# Patient Record
Sex: Male | Born: 1975 | Race: White | Hispanic: No | Marital: Single | Smoking: Never smoker
Health system: Southern US, Community
[De-identification: ages and names within clinical notes are randomized; demographics above are authoritative.]

## PROBLEM LIST (undated history)

## (undated) DIAGNOSIS — F419 Anxiety disorder, unspecified: Secondary | ICD-10-CM

## (undated) DIAGNOSIS — I10 Essential (primary) hypertension: Secondary | ICD-10-CM

## (undated) DIAGNOSIS — E7800 Pure hypercholesterolemia, unspecified: Secondary | ICD-10-CM

## (undated) HISTORY — DX: Pure hypercholesterolemia, unspecified: E78.00

## (undated) HISTORY — DX: Essential (primary) hypertension: I10

## (undated) HISTORY — PX: OTHER SURGICAL HISTORY: SHX169

---

## 2002-12-04 ENCOUNTER — Emergency Department (HOSPITAL_COMMUNITY): Admission: AC | Admit: 2002-12-04 | Discharge: 2002-12-04 | Payer: Self-pay

## 2002-12-04 ENCOUNTER — Encounter: Payer: Self-pay | Admitting: Emergency Medicine

## 2003-02-02 ENCOUNTER — Encounter: Admission: RE | Admit: 2003-02-02 | Discharge: 2003-02-22 | Payer: Self-pay | Admitting: Orthopedic Surgery

## 2006-07-18 ENCOUNTER — Emergency Department (HOSPITAL_COMMUNITY): Admission: EM | Admit: 2006-07-18 | Discharge: 2006-07-18 | Payer: Self-pay | Admitting: Emergency Medicine

## 2010-12-29 ENCOUNTER — Ambulatory Visit (INDEPENDENT_AMBULATORY_CARE_PROVIDER_SITE_OTHER): Payer: 59 | Admitting: Surgery

## 2010-12-29 ENCOUNTER — Encounter (INDEPENDENT_AMBULATORY_CARE_PROVIDER_SITE_OTHER): Payer: Self-pay | Admitting: Surgery

## 2010-12-29 VITALS — BP 122/72 | HR 82 | Temp 97.9°F | Ht 69.0 in | Wt 237.5 lb

## 2010-12-29 DIAGNOSIS — R229 Localized swelling, mass and lump, unspecified: Secondary | ICD-10-CM

## 2010-12-29 DIAGNOSIS — R609 Edema, unspecified: Secondary | ICD-10-CM

## 2010-12-29 NOTE — Progress Notes (Signed)
Benjamin Beck presents with a history of traumatic testicular injury about 10 years ago. His left him with extremely sensitive testicle to compression. He has lived with this but recently had to have a physical for the [employer]. This led to a Dr. Lonia Blood his scrotum causing a lot of pain. His medium reluctant to seek care but he did see Dr. Jeannetta Nap who referred him to me to rule out hernia.  On physical exam his scrotum is about the size of a tennis ball on the left side and it is tender. I think it may be fluid filled sleep this could be a traumatic hydrocele. I cannot feel a definite hernia when I got  him to cough and palpate his internal ring.Will obtain an ultrasound of his testicle to see if this is a traumatic hydrocele or could represent an underlying neoplasm.    I will see him back after the ultrasound to generate an appropriate disposition.

## 2011-01-01 ENCOUNTER — Other Ambulatory Visit (INDEPENDENT_AMBULATORY_CARE_PROVIDER_SITE_OTHER): Payer: Self-pay | Admitting: Surgery

## 2011-01-01 DIAGNOSIS — R609 Edema, unspecified: Secondary | ICD-10-CM

## 2011-01-04 ENCOUNTER — Other Ambulatory Visit: Payer: Self-pay

## 2011-01-05 ENCOUNTER — Other Ambulatory Visit (INDEPENDENT_AMBULATORY_CARE_PROVIDER_SITE_OTHER): Payer: Self-pay | Admitting: General Surgery

## 2011-01-05 ENCOUNTER — Ambulatory Visit
Admission: RE | Admit: 2011-01-05 | Discharge: 2011-01-05 | Disposition: A | Discharge status: Home / Self Care | Payer: 59 | Source: Ambulatory Visit | Attending: Surgery | Admitting: Surgery

## 2011-01-05 ENCOUNTER — Telehealth (INDEPENDENT_AMBULATORY_CARE_PROVIDER_SITE_OTHER): Payer: Self-pay | Admitting: General Surgery

## 2011-01-05 DIAGNOSIS — N433 Hydrocele, unspecified: Secondary | ICD-10-CM

## 2011-01-05 DIAGNOSIS — R609 Edema, unspecified: Secondary | ICD-10-CM

## 2011-01-05 NOTE — Telephone Encounter (Signed)
Notified pts wife of results to [place] and that we will refer him to a urologist.

## 2011-01-05 NOTE — Telephone Encounter (Signed)
Message copied by Latricia Heft on Fri Jan 05, 2011  5:35 PM ------      Message from: Cathi Roan      Created: Fri Jan 05, 2011  2:20 PM       Mother calling for Ultra Sound results.      [phone]

## 2013-02-24 IMAGING — US US SCROTUM
1 series · 13 of 25 positions shown · non-contrast
Comparison: None.

CLINICAL DATA: Left scrotal pain and swelling over the past 3
weeks.

SCROTAL ULTRASOUND
DOPPLER ULTRASOUND OF THE TESTICLES
TECHNIQUE: Complete ultrasound examination of the testicles,
epididymis, and other scrotal structures was performed.  Color and
spectral Doppler ultrasound were also utilized to evaluate blood
flow to the testicles.

[Series 1: us scrotum · 0.08mm/px · 13 of 49 slices shown]
[im 1/49]
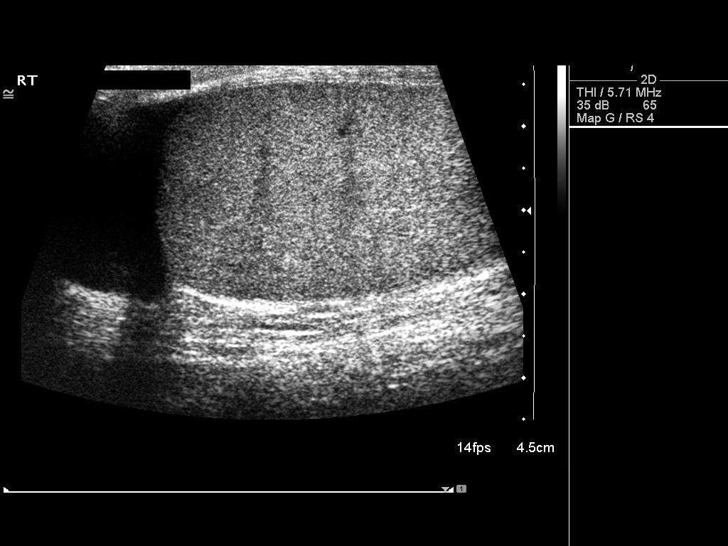
[im 5/49]
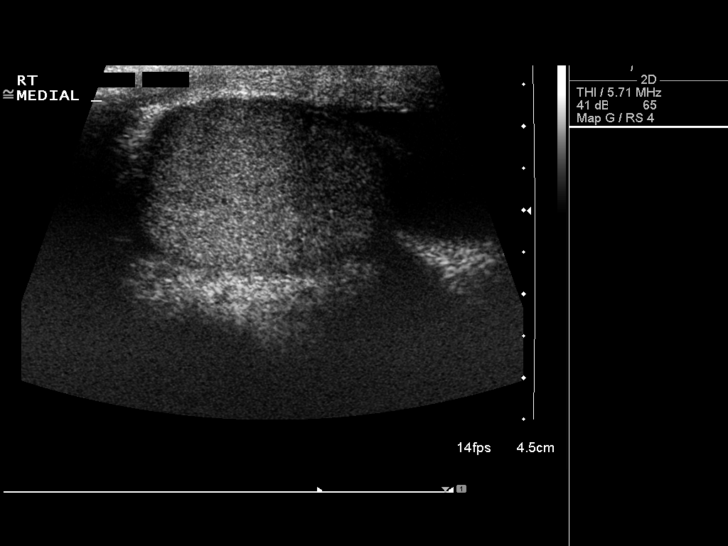
[im 9/49]
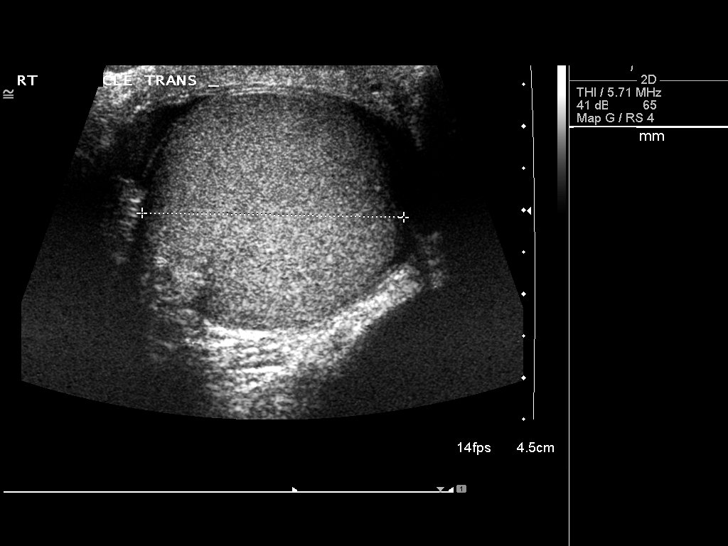
[im 13/49]
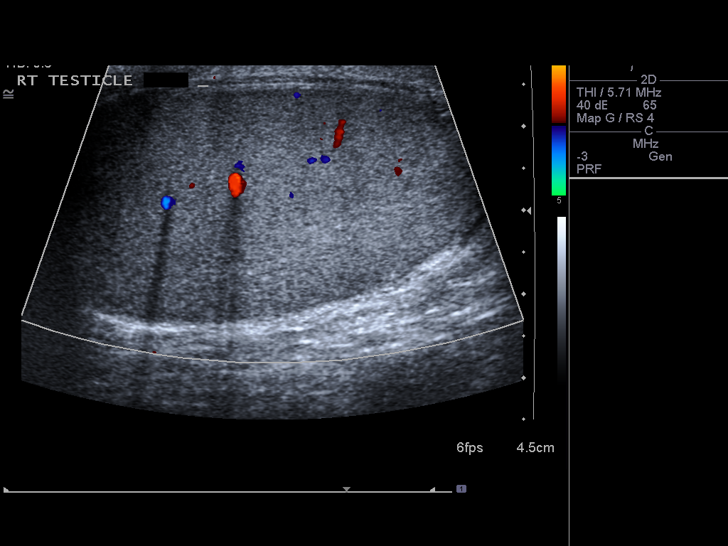
[im 17/49]
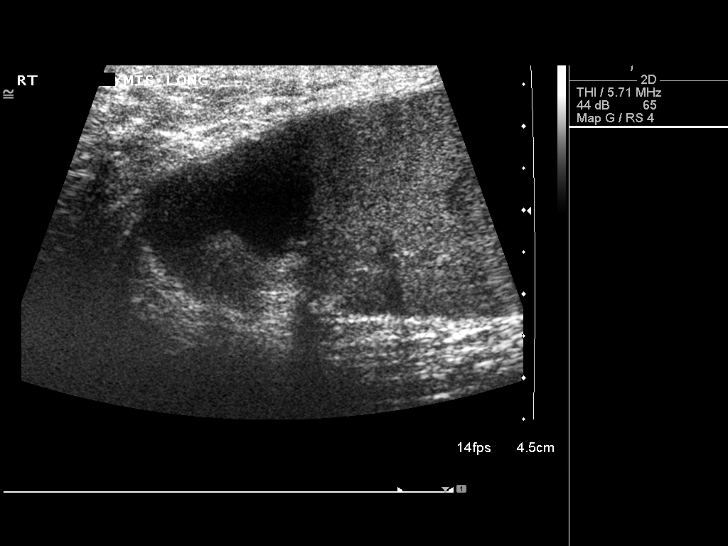
[im 21/49]
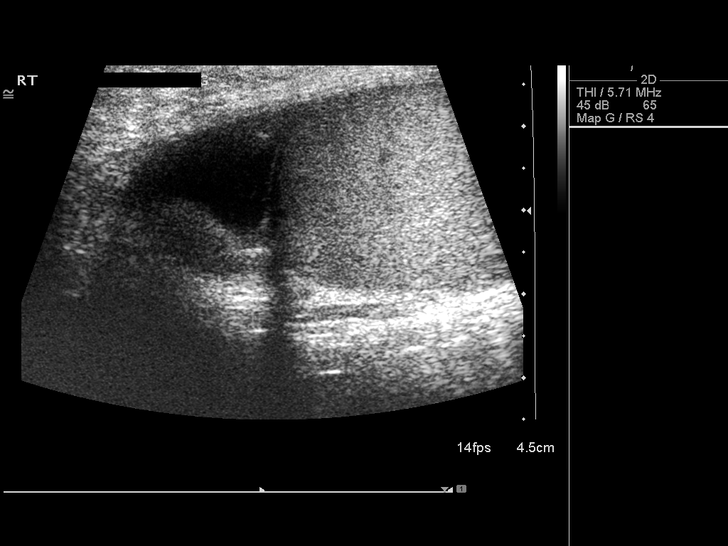
[im 25/49]
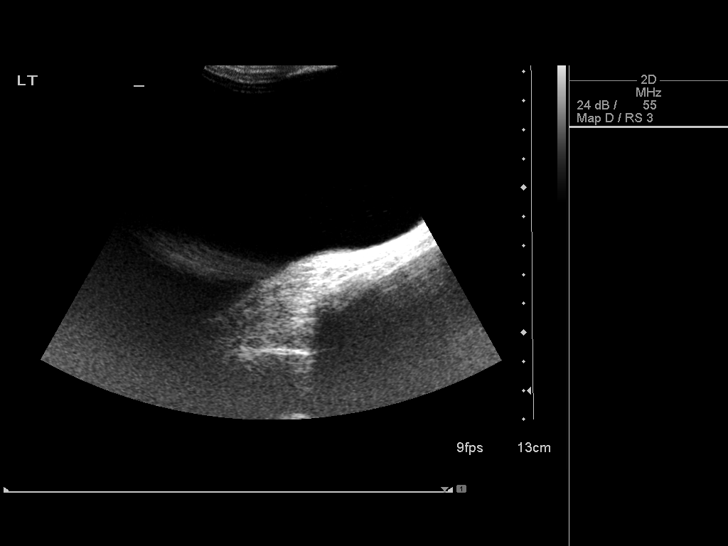
[im 29/49]
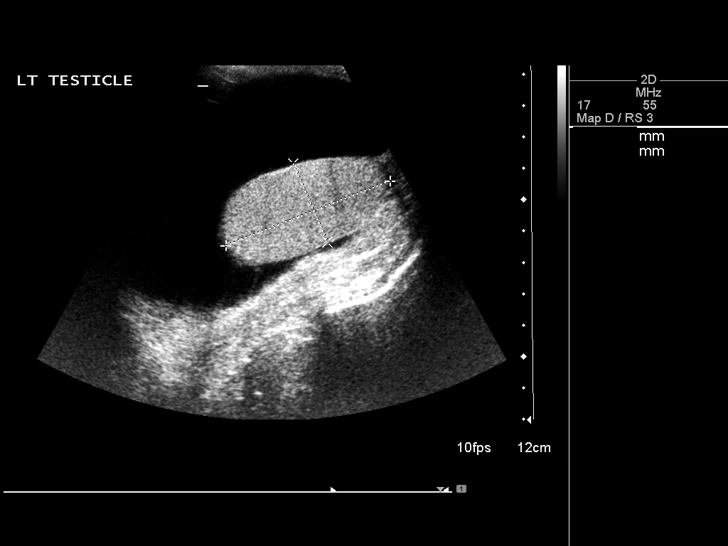
[im 33/49]
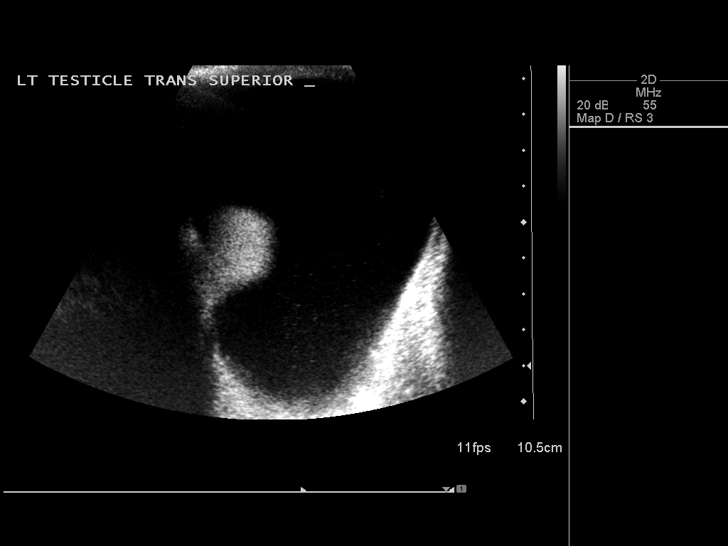
[im 37/49]
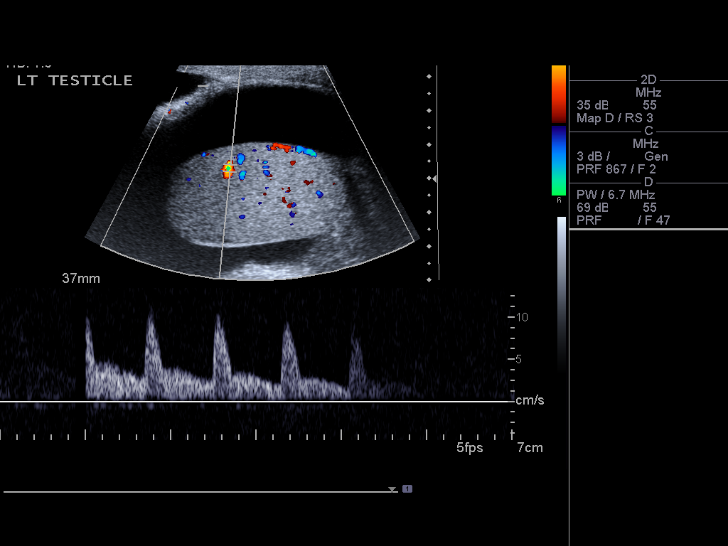
[im 41/49]
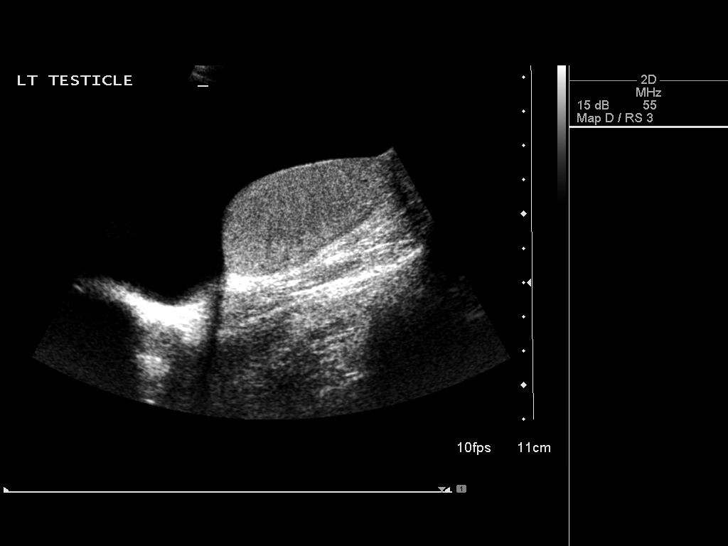
[im 45/49]
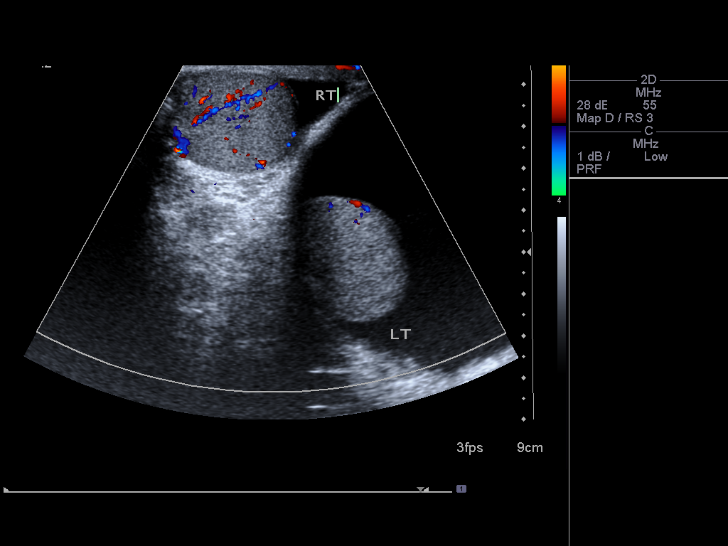
[im 49/49]
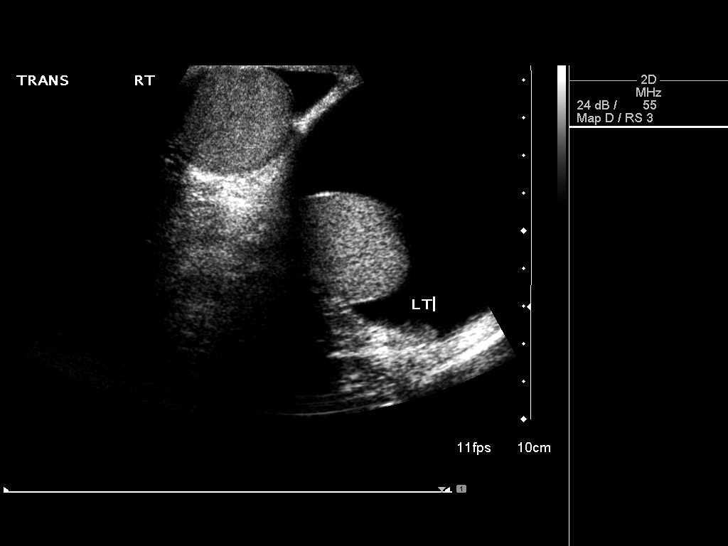

[13 of 25 positions shown; findings below may reference images not displayed]

FINDINGS: Right testis:  Normal size and parenchymal echotexture without
focal parenchymal abnormality, measuring approximately 5.2 x 3.0 x
3.1 cm.  Normal color Doppler signal within the testis.

Left testis:  Normal size and parenchymal echotexture without focal
parenchymal abnormality, measuring approximately 5.7 x 2.7 x
cm.  Normal color Doppler signal within the testis.

Right epididymis:  Limited visualization.  No evidence of
epididymitis.

Left epididymis:  Limited visualization.  No evidence of
epididymitis.

Hydocele:  Very large left hydrocele with simple appearing fluid.
Very small, insignificant right hydrocele.

Varicocele:  Absent bilaterally.

Pulsed Doppler interrogation of both testes demonstrates normal
arterial and venous waveforms in both testes.
IMPRESSION: 1.  Very large, simple appearing left hydrocele.
2.  Otherwise normal scrotal ultrasound.  Specifically, no evidence
of left testicular torsion or epididymo-orchitis.

## 2018-09-08 NOTE — Progress Notes (Signed)
Called patient to set-up covid testing prior to surgery. Patient refuses to have covid testing done and stated "I will cancel my surgery until covid is over".  Patient instructed to call surgeon.

## 2018-09-09 ENCOUNTER — Encounter (HOSPITAL_COMMUNITY): Admission: RE | Payer: Self-pay | Source: Home / Self Care

## 2018-09-09 ENCOUNTER — Ambulatory Visit (HOSPITAL_COMMUNITY): Admission: RE | Admit: 2018-09-09 | Payer: BLUE CROSS/BLUE SHIELD | Source: Home / Self Care | Admitting: Surgery

## 2018-09-09 SURGERY — REPAIR, HERNIA, INGUINAL, ADULT
Anesthesia: General | Laterality: Left

## 2018-12-19 ENCOUNTER — Ambulatory Visit: Payer: Self-pay | Admitting: Surgery

## 2018-12-19 NOTE — Progress Notes (Signed)
Requested orders from triage ccs Dr. Hassell Done

## 2018-12-19 NOTE — Patient Instructions (Signed)
DUE TO COVID-19 ONLY ONE VISITOR IS ALLOWED TO COME WITH YOU AND STAY IN THE WAITING ROOM ONLY DURING PRE OP AND PROCEDURE DAY OF SURGERY. THE 1 VISITOR MAY VISIT WITH YOU AFTER SURGERY IN YOUR PRIVATE ROOM DURING VISITING HOURS ONLY!  YOU NEED TO HAVE A COVID 19 TEST ON_______ @_______ , THIS TEST MUST BE DONE BEFORE SURGERY, COME  [place], [address] , [id number].  ([place]) ONCE YOUR COVID TEST IS COMPLETED, PLEASE BEGIN THE QUARANTINE INSTRUCTIONS AS OUTLINED IN YOUR HANDOUT.                Benjamin Beck  12/19/2018   Your procedure is scheduled on: 12-23-18   Report to [facility] Main  Entrance             Report to admitting at       [place] AM     Call this number if you have problems the morning of surgery [phone]    Remember: Do not eat food or drink liquids :After Midnight. BRUSH YOUR TEETH MORNING OF SURGERY AND RINSE YOUR MOUTH OUT, NO CHEWING GUM CANDY OR MINTS.     Take these medicines the morning of surgery with A SIP OF WATER: none  DO NOT TAKE ANY DIABETIC MEDICATIONS DAY OF YOUR SURGERY                               You may not have any metal on your body including hair pins and              piercings  Do not wear jewelry,lotions, powders or perfumes, deodorant                    Men may shave face and neck.   Do not bring valuables to the hospital. [place].  Contacts, dentures or bridgework may not be worn into surgery.      Patients discharged the day of surgery will not be allowed to drive home. IF YOU ARE HAVING SURGERY AND GOING HOME THE SAME DAY, YOU MUST HAVE AN ADULT TO DRIVE YOU HOME AND BE WITH YOU FOR 24 HOURS. YOU MAY GO HOME BY TAXI OR UBER OR ORTHERWISE, BUT AN ADULT MUST ACCOMPANY YOU HOME AND STAY WITH YOU FOR 24 HOURS.  Name and phone number of your driver:  Special Instructions: N/A              Please read over the following fact sheets you were  given: _____________________________________________________________________             [facility] - Preparing for Surgery Before surgery, you can play an important role.  Because skin is not sterile, your skin needs to be as free of germs as possible.  You can reduce the number of germs on your skin by washing with CHG (chlorahexidine gluconate) soap before surgery.  CHG is an antiseptic cleaner which kills germs and bonds with the skin to continue killing germs even after washing. Please DO NOT use if you have an allergy to CHG or antibacterial soaps.  If your skin becomes reddened/irritated stop using the CHG and inform your nurse when you arrive at Short Stay. Do not shave (including legs and underarms) for at least 48 hours prior to the first CHG  shower.  You may shave your face/neck. Please follow these instructions carefully:  1.  Shower with CHG Soap the night before surgery and the  morning of Surgery.  2.  If you choose to wash your hair, wash your hair first as usual with your  normal  shampoo.  3.  After you shampoo, rinse your hair and body thoroughly to remove the  shampoo.                           4.  Use CHG as you would any other liquid soap.  You can apply chg directly  to the skin and wash                       Gently with a scrungie or clean washcloth.  5.  Apply the CHG Soap to your body ONLY FROM THE NECK DOWN.   Do not use on face/ open                           Wound or open sores. Avoid contact with eyes, ears mouth and genitals (private parts).                       Wash face,  Genitals (private parts) with your normal soap.             6.  Wash thoroughly, paying special attention to the area where your surgery  will be performed.  7.  Thoroughly rinse your body with warm water from the neck down.  8.  DO NOT shower/wash with your normal soap after using and rinsing off  the CHG Soap.                9.  Pat yourself dry with a clean towel.            10.  Wear  clean pajamas.            11.  Place clean sheets on your bed the night of your first shower and do not  sleep with pets. Day of Surgery : Do not apply any lotions/deodorants the morning of surgery.  Please wear clean clothes to the hospital/surgery center.  FAILURE TO FOLLOW THESE INSTRUCTIONS MAY RESULT IN THE CANCELLATION OF YOUR SURGERY PATIENT SIGNATURE_________________________________  NURSE SIGNATURE__________________________________  ________________________________________________________________________

## 2018-12-19 NOTE — Progress Notes (Addendum)
PCP - Nada Boozer Cardiologist - n/a  Chest x-ray -  EKG -  12-23-18 epic Stress Test -  ECHO -  Cardiac Cath -   Sleep Study -  CPAP -   Fasting Blood Sugar -  Checks Blood Sugar ___0__ times a day  Blood Thinner Instructions: Aspirin Instructions:  Anesthesia review:   BS 337 at preop, Phenterminw last dose 12-21-18  ,  hgba1c 8.6  Patient denies shortness of breath, fever, cough and chest pain at PAT appointment   asymptomatic   Patient verbalized understanding of instructions that were given to them at the PAT appointment. Patient was also instructed that they will need to review over the PAT instructions again at home before surgery.

## 2018-12-23 ENCOUNTER — Encounter (HOSPITAL_COMMUNITY): Payer: Self-pay

## 2018-12-23 ENCOUNTER — Other Ambulatory Visit (HOSPITAL_COMMUNITY)
Admission: RE | Admit: 2018-12-23 | Discharge: 2018-12-23 | Disposition: A | Discharge status: Home / Self Care | Payer: BC Managed Care – PPO | Source: Ambulatory Visit | Attending: Surgery | Admitting: Surgery

## 2018-12-23 ENCOUNTER — Encounter (HOSPITAL_COMMUNITY)
Admission: RE | Admit: 2018-12-23 | Discharge: 2018-12-23 | Disposition: A | Discharge status: Home / Self Care | Payer: BC Managed Care – PPO | Source: Ambulatory Visit | Attending: Surgery | Admitting: Surgery

## 2018-12-23 ENCOUNTER — Other Ambulatory Visit: Payer: Self-pay

## 2018-12-23 DIAGNOSIS — Z7984 Long term (current) use of oral hypoglycemic drugs: Secondary | ICD-10-CM | POA: Insufficient documentation

## 2018-12-23 DIAGNOSIS — Z01818 Encounter for other preprocedural examination: Secondary | ICD-10-CM | POA: Insufficient documentation

## 2018-12-23 DIAGNOSIS — Z20828 Contact with and (suspected) exposure to other viral communicable diseases: Secondary | ICD-10-CM | POA: Diagnosis not present

## 2018-12-23 DIAGNOSIS — N433 Hydrocele, unspecified: Secondary | ICD-10-CM | POA: Insufficient documentation

## 2018-12-23 DIAGNOSIS — E7800 Pure hypercholesterolemia, unspecified: Secondary | ICD-10-CM | POA: Diagnosis not present

## 2018-12-23 DIAGNOSIS — Z79899 Other long term (current) drug therapy: Secondary | ICD-10-CM | POA: Insufficient documentation

## 2018-12-23 DIAGNOSIS — F419 Anxiety disorder, unspecified: Secondary | ICD-10-CM | POA: Diagnosis not present

## 2018-12-23 DIAGNOSIS — K4090 Unilateral inguinal hernia, without obstruction or gangrene, not specified as recurrent: Secondary | ICD-10-CM | POA: Insufficient documentation

## 2018-12-23 DIAGNOSIS — E119 Type 2 diabetes mellitus without complications: Secondary | ICD-10-CM | POA: Diagnosis not present

## 2018-12-23 DIAGNOSIS — I10 Essential (primary) hypertension: Secondary | ICD-10-CM | POA: Insufficient documentation

## 2018-12-23 HISTORY — DX: Anxiety disorder, unspecified: F41.9

## 2018-12-23 LAB — CBC
HCT: 48.4 % (ref 39.0–52.0)
Hemoglobin: 15.8 g/dL (ref 13.0–17.0)
MCH: 29.9 pg (ref 26.0–34.0)
MCHC: 32.6 g/dL (ref 30.0–36.0)
MCV: 91.5 fL (ref 80.0–100.0)
Platelets: 168 10*3/uL (ref 150–400)
RBC: 5.29 MIL/uL (ref 4.22–5.81)
RDW: 12.3 % (ref 11.5–15.5)
WBC: 6.8 10*3/uL (ref 4.0–10.5)
nRBC: 0 % (ref 0.0–0.2)

## 2018-12-23 LAB — COMPREHENSIVE METABOLIC PANEL
ALT: 25 U/L (ref 0–44)
AST: 23 U/L (ref 15–41)
Albumin: 4.3 g/dL (ref 3.5–5.0)
Alkaline Phosphatase: 23 U/L — ABNORMAL LOW (ref 38–126)
Anion gap: 12 (ref 5–15)
BUN: 17 mg/dL (ref 6–20)
CO2: 25 mmol/L (ref 22–32)
Calcium: 9.6 mg/dL (ref 8.9–10.3)
Chloride: 98 mmol/L (ref 98–111)
Creatinine, Ser: 1.21 mg/dL (ref 0.61–1.24)
GFR calc Af Amer: >60 mL/min (ref >60)
GFR calc non Af Amer: >60 mL/min (ref >60)
Glucose, Bld: 333 mg/dL — ABNORMAL HIGH (ref 70–99)
Potassium: 4.5 mmol/L (ref 3.5–5.1)
Sodium: 135 mmol/L (ref 135–145)
Total Bilirubin: 0.6 mg/dL (ref 0.3–1.2)
Total Protein: 7.8 g/dL (ref 6.5–8.1)

## 2018-12-23 LAB — HEMOGLOBIN A1C
Hgb A1c MFr Bld: 8.6 % — ABNORMAL HIGH (ref 4.8–5.6)
Mean Plasma Glucose: 200.12 mg/dL

## 2018-12-23 LAB — SARS CORONAVIRUS 2 (TAT 6-24 HRS): SARS Coronavirus 2: NEGATIVE

## 2018-12-23 LAB — GLUCOSE, CAPILLARY: Glucose-Capillary: 337 mg/dL — ABNORMAL HIGH (ref 70–99)

## 2018-12-25 MED ORDER — BUPIVACAINE LIPOSOME 1.3 % IJ SUSP
20.0000 mL | Freq: Once | INTRAMUSCULAR | Status: DC
  Filled 2018-12-25: qty 20

## 2018-12-25 NOTE — H&P (Signed)
Chief Complaint:  Large left inguinal hernia  History of Present Illness:  Benjamin Beck is an 43 y.o. male [facility].  The patient is a 43 year old male who presents with an inguinal hernia. The hernia(s) is/are located on the left side. I saw him 8 years ago and got an ultrasound for hydrocele. We tried to schedule and he says that "I chickened out". Now her has a huge hydrocele that I can see through his [name].   I discussed doing this under general anesthesia and in an open fashion.   Past Medical History:  Diagnosis Date  . Anxiety    does not like needles at all and anxious about surgery  . Diabetes mellitus   . High cholesterol   . Hypertension     Past Surgical History:  Procedure Laterality Date  . EYE SURGERY     left eye rock hit ey had to do surgery  . TONSILLECTOMY    . tubes in ears      Current Facility-Administered Medications  Medication Dose Route Frequency Provider Last Rate Last Dose  . [START ON 12/26/2018] bupivacaine liposome (EXPAREL) 1.3 % injection 266 mg  20 mL Infiltration Once Otho BellowsGreen, Terri L, [facility]       Current Outpatient Medications  Medication Sig Dispense Refill  . lisinopril (ZESTRIL) 20 MG tablet Take 10 mg by mouth every evening.     . metFORMIN (GLUCOPHAGE-XR) 500 MG 24 hr tablet Take 1,500 mg by mouth every evening.    . phentermine (ADIPEX-P) 37.5 MG tablet Take 37.5 mg by mouth daily before breakfast.    . sildenafil (VIAGRA) 100 MG tablet Take 100 mg by mouth daily as needed for erectile dysfunction.    . simvastatin (ZOCOR) 20 MG tablet Take 20 mg by mouth every evening.     Patient has no known allergies. No family history on file. Social History:   reports that he has never smoked. He has never used smokeless tobacco. He reports current alcohol use of about 1.0 standard drinks of alcohol per week. He reports previous drug use.   REVIEW OF SYSTEMS : Negative except for see problem list  Physical Exam:   There were  no vitals taken for this visit. There is no height or weight on file to calculate BMI.  Gen:  WDWN WM NAD  Neurological: Alert and oriented to person, place, and time. Motor and sensory function is grossly intact  Head: Normocephalic and atraumatic.  Eyes: Conjunctivae are normal. Pupils are equal, round, and reactive to light. No scleral icterus.  Neck: Normal range of motion. Neck supple. No tracheal deviation or thyromegaly present.  Cardiovascular:  SR without murmurs or gallops.  No carotid bruits Breast:  Not examined Respiratory: Effort normal.  No respiratory distress. No chest wall tenderness. Breath sounds normal.  No wheezes, rales or rhonchi.  Abdomen:  nontender GU:  Large left inguinal scrotal hernia Musculoskeletal: Normal range of motion. Extremities are nontender. No cyanosis, edema or clubbing noted Lymphadenopathy: No cervical, preauricular, postauricular or axillary adenopathy is present Skin: Skin is warm and dry. No rash noted. No diaphoresis. No erythema. No pallor. Pscyh: Normal mood and affect. Behavior is normal. Judgment and thought content normal.   LABORATORY RESULTS: Results for orders placed or performed during the hospital encounter of 12/23/18 (from the past 48 hour(s))  Glucose, capillary     Status: Abnormal   Collection Time: 12/23/18  2:14 PM  Result Value Ref Range  Glucose-Capillary 337 (H) 70 - 99 mg/dL  Hemoglobin A1c     Status: Abnormal   Collection Time: 12/23/18  2:48 PM  Result Value Ref Range   Hgb A1c MFr Bld 8.6 (H) 4.8 - 5.6 %    Comment: (NOTE) Pre diabetes:          5.7%-6.4% Diabetes:              >6.4% Glycemic control for   <7.0% adults with diabetes    Mean Plasma Glucose 200.12 mg/dL    Comment: Performed at [facility] [address], [place], [place] [address]  CBC     Status: None   Collection Time: 12/23/18  2:48 PM  Result Value Ref Range   WBC 6.8 4.0 - 10.5 K/uL   RBC 5.29 4.22 - 5.81 MIL/uL   Hemoglobin  15.8 13.0 - 17.0 g/dL   HCT 48.4 39.0 - 52.0 %   MCV 91.5 80.0 - 100.0 fL   MCH 29.9 26.0 - 34.0 pg   MCHC 32.6 30.0 - 36.0 g/dL   RDW 12.3 11.5 - 15.5 %   Platelets 168 150 - 400 K/uL   nRBC 0.0 0.0 - 0.2 %    Comment: Performed at [facility], [place] [address] [place], [place] [address]  Comprehensive metabolic panel     Status: Abnormal   Collection Time: 12/23/18  2:48 PM  Result Value Ref Range   Sodium 135 135 - 145 mmol/L   Potassium 4.5 3.5 - 5.1 mmol/L   Chloride 98 98 - 111 mmol/L   CO2 25 22 - 32 mmol/L   Glucose, Bld 333 (H) 70 - 99 mg/dL   BUN 17 6 - 20 mg/dL   Creatinine, Ser 1.21 0.61 - 1.24 mg/dL   Calcium 9.6 8.9 - 10.3 mg/dL   Total Protein 7.8 6.5 - 8.1 g/dL   Albumin 4.3 3.5 - 5.0 g/dL   AST 23 15 - 41 U/L   ALT 25 0 - 44 U/L   Alkaline Phosphatase 23 (L) 38 - 126 U/L   Total Bilirubin 0.6 0.3 - 1.2 mg/dL   GFR calc non Af Amer >60 >60 mL/min   GFR calc Af Amer >60 >60 mL/min   Anion gap 12 5 - 15    Comment: Performed at [facility], [place] [address], [place], [place] [address]     RADIOLOGY RESULTS: No results found.  Problem List: There are no active problems to display for this patient.   Assessment & Plan: Left scrotal hernia--for open LIH repair.      [name] Done, MD, [facility] [phone] beeper [phone]  12/25/2018 10:55 AM

## 2018-12-25 NOTE — Progress Notes (Signed)
Anesthesia Chart Review   Case: [id number] Date/Time: 12/26/18 1100   Procedure: LEFT INGUINAL HERNIA REPAIR HYDROCELE EXCISION (N/A )   Anesthesia type: General   Pre-op diagnosis: LEFT INGUINAL HERNIA AND LARGE HYDROCELE   Location: [place] 06 / [facility]   Surgeon: Johnathan Hausen, MD      DISCUSSION:43 y.o. never smoker with h/o HTN, anxiety, DM II, left inguinal hernia and large hydrocele scheduled for above procedure 12/26/2018 with Dr. Johnathan Hausen.   Elevated blood glucose at PAT visit 12/23/2018, Glucose 333, A1C 8.6.  Pt reports he takes metformin only, at night.  He reports prior to this visit he went to [employer] and had a large meal with several sodas.  Surgeon made aware of blood glucose and A1C.  Advised pt to contact PCP for better DM control.   VS: BP (!) 137/94   Pulse (!) 1   Temp 36.7 C (Oral)   Resp 16   Ht 5\' 10"  (1.778 m)   Wt 98.4 kg   SpO2 100%   BMI 31.14 kg/m   PROVIDERS: Norm Salt, PTA (Inactive)   LABS: Labs reviewed: Acceptable for surgery. (all labs ordered are listed, but only abnormal results are displayed)  Labs Reviewed  HEMOGLOBIN A1C - Abnormal; Notable for the following components:      Result Value   Hgb A1c MFr Bld 8.6 (*)    All other components within normal limits  GLUCOSE, CAPILLARY - Abnormal; Notable for the following components:   Glucose-Capillary 337 (*)    All other components within normal limits  COMPREHENSIVE METABOLIC PANEL - Abnormal; Notable for the following components:   Glucose, Bld 333 (*)    Alkaline Phosphatase 23 (*)    All other components within normal limits  CBC     IMAGES:   EKG: 12/23/2018 Rate 98 bpm Normal sinus rhythm   CV:  Past Medical History:  Diagnosis Date  . Anxiety    does not like needles at all and anxious about surgery  . Diabetes mellitus   . High cholesterol   . Hypertension     Past Surgical History:  Procedure Laterality Date  . EYE SURGERY     left eye rock hit  ey had to do surgery  . TONSILLECTOMY    . tubes in ears      MEDICATIONS: . lisinopril (ZESTRIL) 20 MG tablet  . metFORMIN (GLUCOPHAGE-XR) 500 MG 24 hr tablet  . phentermine (ADIPEX-P) 37.5 MG tablet  . sildenafil (VIAGRA) 100 MG tablet  . simvastatin (ZOCOR) 20 MG tablet   No current facility-administered medications for this encounter.    Derrill Memo ON 12/26/2018] bupivacaine liposome (EXPAREL) 1.3 % injection 266 mg    Maia Plan [facility] Pre-Surgical Testing [phone] 12/25/18  10:40 AM

## 2018-12-26 ENCOUNTER — Ambulatory Visit (HOSPITAL_COMMUNITY)
Admission: RE | Admit: 2018-12-26 | Discharge: 2018-12-26 | Disposition: A | Discharge status: Home / Self Care | Payer: BC Managed Care – PPO | Attending: Surgery | Admitting: Surgery

## 2018-12-26 ENCOUNTER — Encounter (HOSPITAL_COMMUNITY): Payer: Self-pay

## 2018-12-26 ENCOUNTER — Ambulatory Visit (HOSPITAL_COMMUNITY): Payer: BC Managed Care – PPO | Admitting: Certified Registered Nurse Anesthetist

## 2018-12-26 ENCOUNTER — Ambulatory Visit (HOSPITAL_COMMUNITY): Payer: BC Managed Care – PPO | Admitting: Physician Assistant

## 2018-12-26 ENCOUNTER — Encounter (HOSPITAL_COMMUNITY)
Admission: RE | Disposition: A | Discharge status: Home / Self Care | Payer: Self-pay | Source: Home / Self Care | Attending: Surgery

## 2018-12-26 DIAGNOSIS — E7800 Pure hypercholesterolemia, unspecified: Secondary | ICD-10-CM | POA: Diagnosis not present

## 2018-12-26 DIAGNOSIS — K4090 Unilateral inguinal hernia, without obstruction or gangrene, not specified as recurrent: Secondary | ICD-10-CM | POA: Diagnosis not present

## 2018-12-26 DIAGNOSIS — N529 Male erectile dysfunction, unspecified: Secondary | ICD-10-CM | POA: Diagnosis not present

## 2018-12-26 DIAGNOSIS — Z79899 Other long term (current) drug therapy: Secondary | ICD-10-CM | POA: Insufficient documentation

## 2018-12-26 DIAGNOSIS — N433 Hydrocele, unspecified: Secondary | ICD-10-CM | POA: Insufficient documentation

## 2018-12-26 DIAGNOSIS — F419 Anxiety disorder, unspecified: Secondary | ICD-10-CM | POA: Diagnosis not present

## 2018-12-26 DIAGNOSIS — E119 Type 2 diabetes mellitus without complications: Secondary | ICD-10-CM | POA: Diagnosis not present

## 2018-12-26 DIAGNOSIS — I10 Essential (primary) hypertension: Secondary | ICD-10-CM | POA: Insufficient documentation

## 2018-12-26 DIAGNOSIS — Z7984 Long term (current) use of oral hypoglycemic drugs: Secondary | ICD-10-CM | POA: Insufficient documentation

## 2018-12-26 HISTORY — PX: INGUINAL HERNIA REPAIR: SHX194

## 2018-12-26 LAB — GLUCOSE, CAPILLARY: Glucose-Capillary: 200 mg/dL — ABNORMAL HIGH (ref 70–99)

## 2018-12-26 SURGERY — REPAIR, HERNIA, INGUINAL, ADULT
Anesthesia: General | Site: Groin | Laterality: Left

## 2018-12-26 MED ORDER — LACTATED RINGERS IV SOLN
INTRAVENOUS | Status: DC
  Administered 2018-12-26: 10:00:00 via INTRAVENOUS

## 2018-12-26 MED ORDER — ACETAMINOPHEN 500 MG PO TABS
1000.0000 mg | ORAL_TABLET | ORAL | Status: AC
  Administered 2018-12-26: 1000 mg via ORAL
  Filled 2018-12-26: qty 2

## 2018-12-26 MED ORDER — ONDANSETRON HCL 4 MG/2ML IJ SOLN
INTRAMUSCULAR | Status: DC | PRN
  Administered 2018-12-26: 4 mg via INTRAVENOUS

## 2018-12-26 MED ORDER — METOCLOPRAMIDE HCL 5 MG/ML IJ SOLN: 10.0000 mg | Freq: Once | INTRAMUSCULAR | Status: DC | PRN

## 2018-12-26 MED ORDER — HYDROCODONE-ACETAMINOPHEN 5-325 MG PO TABS: 1.0000 | ORAL_TABLET | Freq: Four times a day (QID) | ORAL | 0 refills | Status: AC | PRN

## 2018-12-26 MED ORDER — SODIUM CHLORIDE 0.9 % IR SOLN
Status: DC | PRN
  Administered 2018-12-26: 1000 mL

## 2018-12-26 MED ORDER — LIDOCAINE 2% (20 MG/ML) 5 ML SYRINGE
INTRAMUSCULAR | Status: AC
  Filled 2018-12-26: qty 10

## 2018-12-26 MED ORDER — DEXAMETHASONE SODIUM PHOSPHATE 10 MG/ML IJ SOLN
INTRAMUSCULAR | Status: AC
  Filled 2018-12-26: qty 1

## 2018-12-26 MED ORDER — ROCURONIUM BROMIDE 10 MG/ML (PF) SYRINGE
PREFILLED_SYRINGE | INTRAVENOUS | Status: AC
  Filled 2018-12-26: qty 10

## 2018-12-26 MED ORDER — LIDOCAINE 2% (20 MG/ML) 5 ML SYRINGE
INTRAMUSCULAR | Status: DC | PRN
  Administered 2018-12-26: 100 mg via INTRAVENOUS

## 2018-12-26 MED ORDER — HEPARIN SODIUM (PORCINE) 5000 UNIT/ML IJ SOLN
5000.0000 [IU] | Freq: Once | INTRAMUSCULAR | Status: AC
  Administered 2018-12-26: 10:00:00 5000 [IU] via SUBCUTANEOUS
  Filled 2018-12-26: qty 1

## 2018-12-26 MED ORDER — CHLORHEXIDINE GLUCONATE CLOTH 2 % EX PADS: 6.0000 | MEDICATED_PAD | Freq: Once | CUTANEOUS | Status: DC

## 2018-12-26 MED ORDER — BUPIVACAINE LIPOSOME 1.3 % IJ SUSP
INTRAMUSCULAR | Status: DC | PRN
  Administered 2018-12-26: 20 mL

## 2018-12-26 MED ORDER — FENTANYL CITRATE (PF) 100 MCG/2ML IJ SOLN: 25.0000 ug | INTRAMUSCULAR | Status: DC | PRN

## 2018-12-26 MED ORDER — FENTANYL CITRATE (PF) 100 MCG/2ML IJ SOLN
INTRAMUSCULAR | Status: DC | PRN
  Administered 2018-12-26 (×2): 50 ug via INTRAVENOUS
  Administered 2018-12-26: 100 ug via INTRAVENOUS

## 2018-12-26 MED ORDER — FENTANYL CITRATE (PF) 250 MCG/5ML IJ SOLN
INTRAMUSCULAR | Status: AC
  Filled 2018-12-26: qty 5

## 2018-12-26 MED ORDER — PROPOFOL 10 MG/ML IV BOLUS
INTRAVENOUS | Status: AC
  Filled 2018-12-26: qty 20

## 2018-12-26 MED ORDER — SUGAMMADEX SODIUM 200 MG/2ML IV SOLN
INTRAVENOUS | Status: DC | PRN
  Administered 2018-12-26: 300 mg via INTRAVENOUS

## 2018-12-26 MED ORDER — MIDAZOLAM HCL 5 MG/5ML IJ SOLN
INTRAMUSCULAR | Status: DC | PRN
  Administered 2018-12-26: 2 mg via INTRAVENOUS

## 2018-12-26 MED ORDER — DEXAMETHASONE SODIUM PHOSPHATE 10 MG/ML IJ SOLN
INTRAMUSCULAR | Status: DC | PRN
  Administered 2018-12-26: 10 mg via INTRAVENOUS

## 2018-12-26 MED ORDER — MIDAZOLAM HCL 2 MG/2ML IJ SOLN
INTRAMUSCULAR | Status: AC
  Filled 2018-12-26: qty 2

## 2018-12-26 MED ORDER — SUCCINYLCHOLINE CHLORIDE 200 MG/10ML IV SOSY
PREFILLED_SYRINGE | INTRAVENOUS | Status: AC
  Filled 2018-12-26: qty 10

## 2018-12-26 MED ORDER — PROPOFOL 10 MG/ML IV BOLUS
INTRAVENOUS | Status: DC | PRN
  Administered 2018-12-26: 200 mg via INTRAVENOUS

## 2018-12-26 MED ORDER — LACTATED RINGERS IV SOLN: INTRAVENOUS | Status: DC

## 2018-12-26 MED ORDER — LIDOCAINE 2% (20 MG/ML) 5 ML SYRINGE
INTRAMUSCULAR | Status: AC
  Filled 2018-12-26: qty 5

## 2018-12-26 MED ORDER — ONDANSETRON HCL 4 MG/2ML IJ SOLN
INTRAMUSCULAR | Status: AC
  Filled 2018-12-26: qty 2

## 2018-12-26 MED ORDER — LIDOCAINE 2% (20 MG/ML) 5 ML SYRINGE
INTRAMUSCULAR | Status: DC | PRN
  Administered 2018-12-26: 1 mg/kg/h via INTRAVENOUS

## 2018-12-26 MED ORDER — CEFAZOLIN SODIUM-DEXTROSE 2-4 GM/100ML-% IV SOLN
2.0000 g | INTRAVENOUS | Status: AC
  Administered 2018-12-26: 11:00:00 2 g via INTRAVENOUS
  Filled 2018-12-26: qty 100

## 2018-12-26 MED ORDER — ROCURONIUM BROMIDE 50 MG/5ML IV SOSY
PREFILLED_SYRINGE | INTRAVENOUS | Status: DC | PRN
  Administered 2018-12-26: 200 mg via INTRAVENOUS

## 2018-12-26 MED ORDER — MEPERIDINE HCL 50 MG/ML IJ SOLN: 6.2500 mg | INTRAMUSCULAR | Status: DC | PRN

## 2018-12-26 SURGICAL SUPPLY — 33 items
BLADE SURG 15 STRL LF DISP TIS (BLADE) ×1 IMPLANT
BLADE SURG 15 STRL SS (BLADE) ×2
COVER SURGICAL LIGHT HANDLE (MISCELLANEOUS) ×3 IMPLANT
COVER WAND RF STERILE (DRAPES) IMPLANT
DERMABOND ADVANCED (GAUZE/BANDAGES/DRESSINGS) ×2
DERMABOND ADVANCED .7 DNX12 (GAUZE/BANDAGES/DRESSINGS) IMPLANT
DISSECTOR ROUND CHERRY 3/8 STR (MISCELLANEOUS) IMPLANT
DRAIN PENROSE 18X1/2 LTX STRL (DRAIN) ×3 IMPLANT
DRAPE LAPAROSCOPIC ABDOMINAL (DRAPES) ×2 IMPLANT
ELECT PENCIL ROCKER SW 15FT (MISCELLANEOUS) ×3 IMPLANT
ELECT REM PT RETURN 15FT ADLT (MISCELLANEOUS) ×3 IMPLANT
GLOVE BIOGEL M 8.0 STRL (GLOVE) ×3 IMPLANT
GOWN STRL REUS W/TWL XL LVL3 (GOWN DISPOSABLE) ×4 IMPLANT
KIT BASIN OR (CUSTOM PROCEDURE TRAY) ×3 IMPLANT
KIT TURNOVER KIT A (KITS) IMPLANT
NEEDLE HYPO 22GX1.5 SAFETY (NEEDLE) ×3 IMPLANT
PACK BASIC VI WITH GOWN DISP (CUSTOM PROCEDURE TRAY) ×3 IMPLANT
SPONGE LAP 4X18 RFD (DISPOSABLE) ×5 IMPLANT
STAPLER VISISTAT 35W (STAPLE) IMPLANT
SUPPORT SCROTAL MED ADLT STRP (MISCELLANEOUS) ×1 IMPLANT
SUPPORTER ATHLETIC MED (MISCELLANEOUS) ×1
SUT MNCRL AB 4-0 PS2 18 (SUTURE) ×2 IMPLANT
SUT PROLENE 2 0 CT2 30 (SUTURE) ×6 IMPLANT
SUT SILK 2 0 SH (SUTURE) IMPLANT
SUT VIC AB 2-0 SH 27 (SUTURE) ×2
SUT VIC AB 2-0 SH 27X BRD (SUTURE) ×1 IMPLANT
SUT VIC AB 4-0 SH 18 (SUTURE) ×3 IMPLANT
SUT VICRYL 4-0 (SUTURE) ×3 IMPLANT
SYR 20ML LL LF (SYRINGE) ×3 IMPLANT
SYR BULB IRRIGATION 50ML (SYRINGE) ×3 IMPLANT
TOWEL OR 17X26 10 PK STRL BLUE (TOWEL DISPOSABLE) ×3 IMPLANT
TOWEL OR NON WOVEN STRL DISP B (DISPOSABLE) ×3 IMPLANT
YANKAUER SUCT BULB TIP 10FT TU (MISCELLANEOUS) ×3 IMPLANT

## 2018-12-26 NOTE — Transfer of Care (Signed)
Immediate Anesthesia Transfer of Care Note  Patient: Benjamin Beck  Procedure(s) Performed: LEFT LARGE  HYDROCELE EXCISION (Left Groin)  Patient Location: PACU  Anesthesia Type:General  Level of Consciousness: awake, alert  and oriented  Airway & Oxygen Therapy: Patient Spontanous Breathing and Patient connected to face mask oxygen  Post-op Assessment: Report given to RN and Post -op Vital signs reviewed and stable  Post vital signs: Reviewed and stable  Last Vitals:  Vitals Value Taken Time  BP 158/102 12/26/18 1235  Temp    Pulse 84 12/26/18 1236  Resp 13 12/26/18 1236  SpO2 100 % 12/26/18 1236  Vitals shown include unvalidated device data.  Last Pain:  Vitals:   12/26/18 0940  TempSrc:   PainSc: 0-No pain         Complications: No apparent anesthesia complications

## 2018-12-26 NOTE — Discharge Instructions (Signed)
Hydrocele, Adult °A hydrocele is a collection of fluid in the loose pouch of skin that holds the testicles (scrotum). This may happen because: °· The amount of fluid produced in the scrotum is not absorbed by the rest of the body. °· Fluid from the abdomen fills the scrotum. Normally, the testicles develop in the abdomen then move (drop) into to the scrotum before birth. The tube that the testicles travel through usually closes after the testicles drop. If the tube does not close, fluid from the abdomen can fill the scrotum. This is less common in adults. °What are the causes? °The cause of a hydrocele in adults is usually not known. However, it may be caused by: °· An injury to the scrotum. °· An infection (epididymitis). °· Decreased blood flow to the scrotum. °· Twisting of a testicle (testicular torsion). °· A birth defect. °· A tumor or cancer of the testicle. °What are the signs or symptoms? °A hydrocele feels like a water-filled balloon. It may also feel heavy. Other symptoms include: °· Swelling of the scrotum. The swelling may decrease when you lie down. You may also notice more swelling at night than in the morning. °· Swelling of the groin. °· Mild discomfort in the scrotum. °· Pain. This can develop if the hydrocele was caused by infection or twisting. The larger the hydrocele, the more likely you are to have pain. °How is this diagnosed? °This condition may be diagnosed based on: °· Physical exam. °· Medical history. °You may also have other tests, including: °· Imaging tests, such as ultrasound. °· Blood or urine tests. °How is this treated? °Most hydroceles go away on their own. If you have no discomfort or pain, your health care provider may suggest close monitoring of your condition (called watch and wait or watchful waiting) until the condition goes away or symptoms develop. If treatment is needed, it may include: °· Treating an underlying condition. This may include using an antibiotic medicine to  treat an infection. °· Surgery to stop fluid from collecting in the scrotum. °· Surgery to drain the fluid. Options include: °? Needle aspiration. A needle is used to drain fluid. However, the fluid buildup will come back quickly. °? Hydrocelectomy. For this procedure, an incision is made in the scrotum to remove the fluid sac. °Follow these instructions at home: °· Watch the hydrocele for any changes. °· Take over-the-counter and prescription medicines only as told by your health care provider. °· If you were prescribed an antibiotic medicine, use it as told by your health care provider. Do not stop taking the antibiotic even if you start to feel better. °· Keep all follow-up visits as told by your health care provider. This is important. °Contact a health care provider if: °· You notice any changes in the hydrocele. °· The swelling in your scrotum or groin gets worse. °· The hydrocele becomes red, firm, painful, or tender to the touch. °· You have a fever. °Get help right away if you: °· Develop a lot of pain, or your pain becomes worse. °Summary °· A hydrocele is a collection of fluid in the loose pouch of skin that holds the testicles (scrotum). °· Hydroceles can cause swelling, discomfort, and sometimes pain. °· In adults, the cause of a hydrocele usually is not known. However, it is sometimes caused by an infection or a rotation and twisting of the scrotum. °· Treatment is usually not needed. Hydroceles often go away on their own. If a hydrocele causes pain, treatment   may be given to ease the pain. °This information is not intended to replace advice given to you by your health care provider. Make sure you discuss any questions you have with your health care provider. °Document Released: 09/27/2009 Document Revised: 04/20/2017 Document Reviewed: 04/20/2017 °Elsevier Patient Education © 2020 [employer] ° °

## 2018-12-26 NOTE — Anesthesia Postprocedure Evaluation (Signed)
Anesthesia Post Note  Patient: Benjamin Beck  Procedure(s) Performed: LEFT LARGE  HYDROCELE EXCISION (Left Groin)     Patient location during evaluation: PACU Anesthesia Type: General Level of consciousness: awake and alert Pain management: pain level controlled Vital Signs Assessment: post-procedure vital signs reviewed and stable Respiratory status: spontaneous breathing, nonlabored ventilation, respiratory function stable and patient connected to nasal cannula oxygen Cardiovascular status: blood pressure returned to baseline and stable Postop Assessment: no apparent nausea or vomiting Anesthetic complications: no    Last Vitals:  Vitals:   12/26/18 1318 12/26/18 1337  BP: (!) 134/96 (!) 131/94  Pulse: 76 74  Resp: 18 16  Temp: 36.5 C   SpO2: 98% 96%    Last Pain:  Vitals:   12/26/18 1337  TempSrc:   PainSc: 2                  Montez Hageman

## 2018-12-26 NOTE — Anesthesia Preprocedure Evaluation (Signed)
Anesthesia Evaluation  Patient identified by MRN, date of birth, ID band Patient awake    Reviewed: Allergy & Precautions, NPO status , Patient's Chart, lab work & pertinent test results  Airway Mallampati: II  TM Distance: >3 FB Neck ROM: Full    Dental no notable dental hx.    Pulmonary neg pulmonary ROS,    Pulmonary exam normal breath sounds clear to auscultation       Cardiovascular hypertension, Pt. on medications Normal cardiovascular exam Rhythm:Regular Rate:Normal     Neuro/Psych Anxiety negative neurological ROS     GI/Hepatic negative GI ROS, Neg liver ROS,   Endo/Other  diabetes, Type 2  Renal/GU negative Renal ROS  negative genitourinary   Musculoskeletal negative musculoskeletal ROS (+)   Abdominal   Peds negative pediatric ROS (+)  Hematology negative hematology ROS (+)   Anesthesia Other Findings   Reproductive/Obstetrics negative OB ROS                             Anesthesia Physical Anesthesia Plan  ASA: II  Anesthesia Plan: General   Post-op Pain Management:    Induction: Intravenous  PONV Risk Score and Plan: 2 and Ondansetron and Treatment may vary due to age or medical condition  Airway Management Planned: LMA and Oral ETT  Additional Equipment:   Intra-op Plan:   Post-operative Plan: Extubation in OR  Informed Consent: I have reviewed the patients History and Physical, chart, labs and discussed the procedure including the risks, benefits and alternatives for the proposed anesthesia with the patient or authorized representative who has indicated his/her understanding and acceptance.     Dental advisory given  Plan Discussed with: CRNA  Anesthesia Plan Comments:         Anesthesia Quick Evaluation

## 2018-12-26 NOTE — Interval H&P Note (Signed)
History and Physical Interval Note:  12/26/2018 10:58 AM  Benjamin Beck  has presented today for surgery, with the diagnosis of LEFT INGUINAL HERNIA AND LARGE HYDROCELE.  The various methods of treatment have been discussed with the patient and family. After consideration of risks, benefits and other options for treatment, the patient has consented to  Procedure(s): LEFT INGUINAL HERNIA REPAIR  AND  HYDROCELE EXCISION (N/A) as a surgical intervention.  The patient's history has been reviewed, patient examined, no change in status, stable for surgery.  I have reviewed the patient's chart and labs.  Questions were answered to the patient's satisfaction.     Pedro Earls

## 2018-12-26 NOTE — Anesthesia Procedure Notes (Signed)
Procedure Name: Intubation Date/Time: 12/26/2018 11:14 AM Performed by: Maxwell Caul, CRNA Pre-anesthesia Checklist: Patient identified, Emergency Drugs available, Suction available and Patient being monitored Patient Re-evaluated:Patient Re-evaluated prior to induction Oxygen Delivery Method: Circle system utilized Preoxygenation: Pre-oxygenation with 100% oxygen Induction Type: IV induction Ventilation: Mask ventilation without difficulty Laryngoscope Size: Mac and 4 Grade View: Grade I Tube type: Oral Tube size: 7.5 mm Number of attempts: 1 Airway Equipment and Method: Stylet Placement Confirmation: ETT inserted through vocal cords under direct vision,  positive ETCO2 and breath sounds checked- equal and bilateral Secured at: 22 cm Tube secured with: Tape Dental Injury: Teeth and Oropharynx as per pre-operative assessment

## 2018-12-26 NOTE — Op Note (Signed)
Benjamin Beck  1975/09/15 4 Sept 2022    PCP:  Norm Salt, PTA (Inactive)   Surgeon: Kaylyn Lim, MD, FACS  Asst:  none  Anes:  general  Preop Dx: Left scrotal hydrocele, left indirect inguinal hernia Postop Dx: 750 cc left scrotal hydrocele  Procedure: Left hydrocelectomy Location Surgery: [facility] 6 Complications: None noted  EBL:   minimal cc  Drains: none  Description of Procedure:  The patient was taken to OR 6 .  After anesthesia was administered and the patient was prepped  with [employer] and a timeout was performed.  The left side had been marked and informed consent was obtained regarding his surgery including possible colectomy.  I had worked him up 8 years ago when he had declined surgery.  We talked about this in the office I also spoke with him by phone yesterday to try to allay his anxiety.  I made a small oblique incision at the outermost portions of his external ring and begin mobilizing this hernia sac.  Because of its size elected to go ahead and decompress it and got at least 750 cc of straw-colored fluid out of it.  I was unable to bring up a large portion of this leaving his testicle in his scrotum and then excising a portion of this staying away from the cord structures which were the more proximal.  I explored it proximally and there was no communicating hydrocele.  When a portion of the sac was excised I then oversewed to evert the sac with a running locking 2-0 Vicryl.  This exteriorized a portion of the sac which allowed to lie back down into his scrotum.  The wound was then closed in layers with 4-0 Vicryl with a running subcuticular 4-0 Monocryl and Dermabond.  Exparel was injected into the wound and a scrotal support was applied.  Patient tolerated procedure well was taken recovery room in satisfactory condition.  The patient tolerated the procedure well and was taken to the PACU in stable condition.     [name] Done, [place], [facility] Surgery,  [place]

## 2018-12-30 ENCOUNTER — Encounter (HOSPITAL_COMMUNITY): Payer: Self-pay | Admitting: Surgery

## 2023-06-27 ENCOUNTER — Other Ambulatory Visit (HOSPITAL_COMMUNITY): Payer: Self-pay | Admitting: Student

## 2023-06-27 DIAGNOSIS — R109 Unspecified abdominal pain: Secondary | ICD-10-CM

## 2023-07-01 ENCOUNTER — Ambulatory Visit (HOSPITAL_COMMUNITY)
Admission: RE | Admit: 2023-07-01 | Discharge: 2023-07-01 | Disposition: A | Discharge status: Home / Self Care | Source: Ambulatory Visit | Attending: Student | Admitting: Student

## 2023-07-01 DIAGNOSIS — R109 Unspecified abdominal pain: Secondary | ICD-10-CM | POA: Diagnosis present

## 2023-07-08 ENCOUNTER — Ambulatory Visit (HOSPITAL_COMMUNITY)
Admission: RE | Admit: 2023-07-08 | Discharge: 2023-07-08 | Disposition: A | Discharge status: Home / Self Care | Source: Ambulatory Visit | Attending: Student | Admitting: Student

## 2023-07-08 ENCOUNTER — Encounter (HOSPITAL_COMMUNITY): Payer: Self-pay

## 2023-07-08 ENCOUNTER — Other Ambulatory Visit (HOSPITAL_COMMUNITY): Payer: Self-pay | Admitting: Student

## 2023-07-08 DIAGNOSIS — R109 Unspecified abdominal pain: Secondary | ICD-10-CM

## 2023-07-09 ENCOUNTER — Ambulatory Visit (HOSPITAL_COMMUNITY)
Admission: RE | Admit: 2023-07-09 | Discharge: 2023-07-09 | Disposition: A | Discharge status: Home / Self Care | Source: Ambulatory Visit | Attending: Student | Admitting: Student

## 2023-07-09 DIAGNOSIS — R109 Unspecified abdominal pain: Secondary | ICD-10-CM | POA: Diagnosis present

## 2023-07-09 MED ORDER — GADOBUTROL 1 MMOL/ML IV SOLN
10.0000 mL | Freq: Once | INTRAVENOUS | Status: AC | PRN
  Administered 2023-07-09: 10 mL via INTRAVENOUS

## 2023-09-09 ENCOUNTER — Other Ambulatory Visit: Payer: Self-pay | Admitting: Gastroenterology

## 2023-09-09 DIAGNOSIS — R634 Abnormal weight loss: Secondary | ICD-10-CM

## 2023-09-09 DIAGNOSIS — R109 Unspecified abdominal pain: Secondary | ICD-10-CM

## 2023-11-12 ENCOUNTER — Other Ambulatory Visit (HOSPITAL_COMMUNITY): Payer: Self-pay | Admitting: Student

## 2023-11-12 DIAGNOSIS — R634 Abnormal weight loss: Secondary | ICD-10-CM

## 2023-11-12 DIAGNOSIS — R109 Unspecified abdominal pain: Secondary | ICD-10-CM

## 2023-11-22 ENCOUNTER — Encounter (HOSPITAL_COMMUNITY): Payer: Self-pay

## 2023-11-22 ENCOUNTER — Encounter (HOSPITAL_COMMUNITY)
Admission: RE | Admit: 2023-11-22 | Discharge: 2023-11-22 | Disposition: A | Discharge status: Home / Self Care | Source: Ambulatory Visit | Attending: Student | Admitting: Student

## 2023-11-22 DIAGNOSIS — R634 Abnormal weight loss: Secondary | ICD-10-CM | POA: Insufficient documentation

## 2023-11-22 DIAGNOSIS — R109 Unspecified abdominal pain: Secondary | ICD-10-CM | POA: Insufficient documentation

## 2023-11-29 ENCOUNTER — Encounter (HOSPITAL_COMMUNITY)
Admission: RE | Admit: 2023-11-29 | Discharge: 2023-11-29 | Disposition: A | Discharge status: Home / Self Care | Source: Ambulatory Visit | Attending: Student | Admitting: Student

## 2023-11-29 ENCOUNTER — Encounter (HOSPITAL_COMMUNITY): Payer: Self-pay

## 2023-11-29 DIAGNOSIS — R634 Abnormal weight loss: Secondary | ICD-10-CM | POA: Diagnosis present

## 2023-11-29 DIAGNOSIS — R109 Unspecified abdominal pain: Secondary | ICD-10-CM | POA: Diagnosis present

## 2023-11-29 MED ORDER — TECHNETIUM TC 99M SULFUR COLLOID
1.9000 | Freq: Once | INTRAVENOUS | Status: AC
  Administered 2023-11-29: 1.9 via ORAL

## 2024-04-29 ENCOUNTER — Other Ambulatory Visit: Payer: Self-pay | Admitting: Gastroenterology

## 2024-04-29 DIAGNOSIS — R109 Unspecified abdominal pain: Secondary | ICD-10-CM

## 2024-04-29 DIAGNOSIS — R634 Abnormal weight loss: Secondary | ICD-10-CM

## 2024-05-07 ENCOUNTER — Encounter: Payer: Self-pay | Admitting: Gastroenterology

## 2024-05-13 ENCOUNTER — Other Ambulatory Visit

## 2024-05-26 ENCOUNTER — Other Ambulatory Visit: Payer: Self-pay | Admitting: Oncology

## 2024-05-26 DIAGNOSIS — R162 Hepatomegaly with splenomegaly, not elsewhere classified: Secondary | ICD-10-CM
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
## 2024-05-27 ENCOUNTER — Inpatient Hospital Stay: Admitting: Oncology

## 2024-05-27 ENCOUNTER — Inpatient Hospital Stay

## 2024-05-27 ENCOUNTER — Encounter: Payer: Self-pay | Admitting: Oncology

## 2024-05-27 ENCOUNTER — Other Ambulatory Visit: Payer: Self-pay

## 2024-05-27 ENCOUNTER — Other Ambulatory Visit: Payer: Self-pay | Admitting: Oncology

## 2024-05-27 VITALS — BP 155/101 | HR 90 | Temp 98.0°F | Resp 16 | Ht 70.0 in | Wt 142.5 lb

## 2024-05-27 DIAGNOSIS — R162 Hepatomegaly with splenomegaly, not elsewhere classified: Secondary | ICD-10-CM

## 2024-05-27 LAB — CMP (CANCER CENTER ONLY)
ALT: 7 U/L (ref 0–44)
AST: 17 U/L (ref 15–41)
Albumin: 4.5 g/dL (ref 3.5–5.0)
Alkaline Phosphatase: 30 U/L — ABNORMAL LOW (ref 38–126)
Anion gap: 13 (ref 5–15)
BUN: 21 mg/dL — ABNORMAL HIGH (ref 6–20)
CO2: 26 mmol/L (ref 22–32)
Calcium: 10.4 mg/dL — ABNORMAL HIGH (ref 8.9–10.3)
Chloride: 99 mmol/L (ref 98–111)
Creatinine: 1.28 mg/dL — ABNORMAL HIGH (ref 0.61–1.24)
GFR, Estimated: >60 mL/min
Glucose, Bld: 261 mg/dL — ABNORMAL HIGH (ref 70–99)
Potassium: 4.5 mmol/L (ref 3.5–5.1)
Sodium: 138 mmol/L (ref 135–145)
Total Bilirubin: 0.4 mg/dL (ref 0.0–1.2)
Total Protein: 7.6 g/dL (ref 6.5–8.1)

## 2024-05-27 LAB — CBC WITH DIFFERENTIAL (CANCER CENTER ONLY)
Abs Immature Granulocytes: 0.01 10*3/uL (ref 0.00–0.07)
Basophils Absolute: 0.1 10*3/uL (ref 0.0–0.1)
Basophils Relative: 2 %
Eosinophils Absolute: 0.1 10*3/uL (ref 0.0–0.5)
Eosinophils Relative: 2 %
HCT: 47.5 % (ref 39.0–52.0)
Hemoglobin: 15.9 g/dL (ref 13.0–17.0)
Immature Granulocytes: 0 %
Lymphocytes Relative: 31 %
Lymphs Abs: 2 10*3/uL (ref 0.7–4.0)
MCH: 29.4 pg (ref 26.0–34.0)
MCHC: 33.5 g/dL (ref 30.0–36.0)
MCV: 88 fL (ref 80.0–100.0)
Monocytes Absolute: 0.4 10*3/uL (ref 0.1–1.0)
Monocytes Relative: 6 %
Neutro Abs: 3.9 10*3/uL (ref 1.7–7.7)
Neutrophils Relative %: 59 %
Platelet Count: 249 10*3/uL (ref 150–400)
RBC: 5.4 MIL/uL (ref 4.22–5.81)
RDW: 13.2 % (ref 11.5–15.5)
WBC Count: 6.6 10*3/uL (ref 4.0–10.5)
nRBC: 0 % (ref 0.0–0.2)

## 2024-05-27 LAB — TSH: TSH: 0.673 u[IU]/mL (ref 0.350–4.500)

## 2024-05-27 LAB — FERRITIN: Ferritin: 597 ng/mL — ABNORMAL HIGH (ref 24–336)

## 2024-05-27 LAB — T4, FREE: Free T4: 1.7 ng/dL (ref 0.80–2.00)

## 2024-05-27 LAB — LACTATE DEHYDROGENASE: LDH: 182 U/L (ref 105–235)

## 2024-05-27 LAB — FOLATE: Folate: 10.8 ng/mL

## 2024-05-27 LAB — IRON AND TIBC
Iron: 72 ug/dL (ref 45–182)
Saturation Ratios: 23 % (ref 17.9–39.5)
TIBC: 319 ug/dL (ref 250–450)
UIBC: 247 ug/dL

## 2024-05-27 LAB — SEDIMENTATION RATE: Sed Rate: 17 mm/h — ABNORMAL HIGH (ref 0–16)

## 2024-05-27 LAB — VITAMIN B12: Vitamin B-12: 567 pg/mL (ref 180–914)

## 2024-05-28 ENCOUNTER — Telehealth: Payer: Self-pay

## 2024-05-28 LAB — HEPATITIS PANEL, ACUTE
HCV Ab: NONREACTIVE
Hep A IgM: NONREACTIVE
Hep B C IgM: NONREACTIVE
Hepatitis B Surface Ag: NONREACTIVE

## 2024-05-28 LAB — HIV ANTIBODY (ROUTINE TESTING W REFLEX): HIV Screen 4th Generation wRfx: NONREACTIVE

## 2024-05-28 NOTE — Telephone Encounter (Signed)
 [facility] Clinical Social Work  Clinical Social Work was referred by medical provider for SDOH needs.  Clinical Social Worker contacted patient by phone to offer support and assess for needs.  Patient reported no concerns with mood. No SI/HI. Patient expressed desire for health to improve. Only concerns is fatigue. No other needs at this time. Referral will be closed.   Lizbeth Sprague, LCSW  Clinical Social Worker [facility]
